# Patient Record
Sex: Male | Born: 2000 | Hispanic: Yes | Marital: Single | Smoking: Never smoker
Health system: Southern US, Community
[De-identification: ages and names within clinical notes are randomized; demographics above are authoritative.]

## PROBLEM LIST (undated history)

## (undated) DIAGNOSIS — R040 Epistaxis: Secondary | ICD-10-CM

## (undated) DIAGNOSIS — G43909 Migraine, unspecified, not intractable, without status migrainosus: Secondary | ICD-10-CM

## (undated) HISTORY — DX: Epistaxis: R04.0

## (undated) HISTORY — PX: TONSILLECTOMY: SUR1361

---
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
## 2022-10-04 ENCOUNTER — Other Ambulatory Visit: Payer: Self-pay

## 2022-10-24 ENCOUNTER — Ambulatory Visit (HOSPITAL_COMMUNITY)
Admission: EM | Admit: 2022-10-24 | Discharge: 2022-10-24 | Disposition: A | Discharge status: Home / Self Care | Payer: Medicaid Other | Attending: Internal Medicine | Admitting: Internal Medicine

## 2022-10-24 ENCOUNTER — Encounter (HOSPITAL_COMMUNITY): Payer: Self-pay | Admitting: Emergency Medicine

## 2022-10-24 ENCOUNTER — Other Ambulatory Visit: Payer: Self-pay

## 2022-10-24 DIAGNOSIS — L600 Ingrowing nail: Secondary | ICD-10-CM

## 2022-10-24 MED ORDER — BUPIVACAINE HCL (PF) 0.5 % IJ SOLN
INTRAMUSCULAR | Status: AC
Start: 2022-10-24 — End: ?
  Filled 2022-10-24: qty 10

## 2022-10-24 MED ORDER — CEPHALEXIN 500 MG PO CAPS: 500.0000 mg | ORAL_CAPSULE | Freq: Three times a day (TID) | ORAL | 0 refills | Status: AC

## 2022-10-24 NOTE — Discharge Instructions (Signed)
Keflex 3 times a day for 7 days to treat infection. Numbing will wear off in the next several hours.  Ibuprofen as needed for pain once numbing wears off. Epsom salt soaks every 4 hours for the next few days.  Keep wound cleansed, dry, and wrapped.   Work note at the end of the packet.  [facility] with [facility] in 1-2 weeks.  If you develop any new or worsening symptoms or do not improve in the next 2 to 3 days, please return.  If your symptoms are severe, please go to the emergency room.  Follow-up with your primary care provider for further evaluation and management of your symptoms as well as ongoing wellness visits.  I hope you feel better!

## 2022-10-24 NOTE — ED Notes (Signed)
Reviewed work note and patient was giving wound dressing supplies

## 2022-10-24 NOTE — ED Provider Notes (Signed)
[facility]    CSN: [id number] Arrival date & time: 10/24/22  1191      History   Chief Complaint Chief Complaint  Patient presents with   Ingrown Toenail    HPI Casey Gutierrez is a 22 y.o. male.   Patient presents to urgent care for evaluation of redness, swelling, pain, and purulent drainage to the lateral aspect of the left great toenail that started a few days ago but has worsened in the last 24 hours.  He states he works at a [employer] and his steel toed shoes are not tight.  He has plenty of room to move his feet in his shoes, however reports his toes "slide around a lot" and he believes this may have caused some irritation to the left great toenail.  Denies a history of ingrown toenails, numbness and tingling distally to injury, and recent fever/chills.  No recent injury/trauma to the left great toe.  Over the last 2 to 3 days, he has noticed purulent drainage from the lateral nail fold of the left great toe after long periods of time on his feet at work and his steel toed shoes.  He has not trimmed his toenails recently, however did attempt to relieve ingrown toenail by trimming the nail yesterday.  He believes this made this worse.  Using peroxide to the wound without much relief.  He is not diabetic and denies history of immunosuppression.     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 Authorizing Provider  cephALEXin  (KEFLEX) 500 MG capsule Take 1 capsule (500 mg total) by mouth 3 (three) times daily for 7 days. 10/24/22 10/31/22 Yes Brennon Otterness, Donavan Burnet, FNP  montelukast (SINGULAIR) 10 MG tablet Take 1 tablet (10 mg total) by mouth at bedtime. Patient not taking: Reported on 10/24/2022 08/26/21   Maretta Bees, PA    Family History Family History  Problem Relation Age of Onset   Hypertension Mother    Diabetes Mother    Obesity Brother        also with non-alcoholic fatty liver disease   Obesity Brother        also with nonalcoholic fatty liver disease    Social History Social History   Tobacco Use   Smoking status: Never   Smokeless tobacco: Never  Vaping Use   Vaping Use: Never used  Substance Use Topics   Alcohol use: No   Drug use: Never     Allergies   Patient has no known allergies.   Review of Systems Review of Systems Per HPI  Physical Exam Triage Vital Signs ED Triage Vitals  Enc Vitals Group     BP 10/24/22 0935 127/72     Pulse Rate 10/24/22 0935 70     Resp 10/24/22 0935 18     Temp 10/24/22 0935 98 F (36.7 C)     Temp Source 10/24/22  0935 Oral     SpO2 10/24/22 0935 97 %     Weight --      Height --      Head Circumference --      Peak Flow --      Pain Score 10/24/22 0932 8     Pain Loc --      Pain Edu? --      Excl. in GC? --    No data found.  Updated Vital Signs BP 127/72 (BP Location: Right Arm)   Pulse 70   Temp 98 F (36.7 C) (Oral)   Resp 18   SpO2 97%   Visual Acuity Right Eye Distance:   Left Eye Distance:   Bilateral Distance:    Right Eye Near:   Left Eye Near:    Bilateral Near:     Physical Exam Vitals and nursing note reviewed.  Constitutional:      Appearance: He is not ill-appearing or toxic-appearing.  HENT:     Head: Normocephalic and atraumatic.     Right Ear: Hearing and external ear normal.     Left Ear: Hearing and external ear normal.     Nose: Nose normal.     Mouth/Throat:     Lips: Pink.  Eyes:      General: Lids are normal. Vision grossly intact. Gaze aligned appropriately.     Extraocular Movements: Extraocular movements intact.     Conjunctiva/sclera: Conjunctivae normal.  Cardiovascular:     Pulses:          Posterior tibial pulses are 2+ on the left side.  Pulmonary:     Effort: Pulmonary effort is normal.  Musculoskeletal:     Cervical back: Neck supple.  Feet:     Left foot:     Skin integrity: Erythema and warmth present.     Toenail Condition: Left toenails are ingrown.     Comments: Ingrown toenail to the lateral nail fold of the left great toe.  Significant swelling, erythema, and tenderness present to lateral nail fold of affected digit.  +2 dorsalis pedis pulse to the left foot with less than 3 capillary refill to affected digit.  Sensation and strength intact distally.  No signs of injury/trauma to the left great toe.  See image below for further details. Skin:    General: Skin is warm and dry.     Capillary Refill: Capillary refill takes less than 2 seconds.     Findings: No rash.  Neurological:     General: No focal deficit present.     Mental Status: He is alert and oriented to person, place, and time. Mental status is at baseline.     Cranial Nerves: No dysarthria or facial asymmetry.  Psychiatric:        Mood and Affect: Mood normal.        Speech: Speech normal.        Behavior: Behavior normal.        Thought Content: Thought content normal.        Judgment: Judgment normal.     UC Treatments / Results  Labs (all labs ordered are listed, but only abnormal results are displayed) Labs Reviewed - No data to display  EKG   Radiology No results found.  Procedures Excise Ingrown Toenail  Date/Time: 10/24/2022 10:55 AM  Performed by: Carlisle Beers, FNP Authorized by: Carlisle Beers, FNP   Consent:    Consent obtained:  Verbal   Consent given by:  Patient   Risks, benefits, and alternatives were discussed: yes     Risks discussed:   Bleeding, incomplete removal, infection, permanent nail deformity and pain   Alternatives discussed:  No treatment and delayed treatment Universal protocol:    Patient identity confirmed:  Verbally with patient Location:    Foot:  L big toe Pre-procedure details:    Skin preparation:  Alcohol and povidone-iodine Anesthesia:    Anesthesia method:  Nerve block   Block location:  First toe of left foot   Block needle gauge:  25 G   Block anesthetic:  Bupivacaine 0.5% w/o epi   Block injection procedure:  Anatomic landmarks palpated, anatomic landmarks identified, introduced needle, incremental injection and negative aspiration for blood   Block outcome:  Anesthesia achieved Ingrown nail:    Wedge excision of skin: yes     Nail matrix removed or ablated:  Partial (medial) Nails trimmed:    Number of nails trimmed:  1 Post-procedure details:    Dressing: non-stick gauze and coban.   Procedure completion:  Tolerated well, no immediate complications  (including critical care time)  Medications Ordered in UC Medications - No data to display  Initial Impression / Assessment and Plan / UC Course  I have reviewed the triage vital signs and the nursing notes.  Pertinent labs & imaging results that were available during my care of the patient were reviewed by me and considered in my medical decision making (see chart for details).   1. Ingrown toenail of left foot with infection Ingrown toenail of the left great toe excised, see procedure note above for further details.  Epsom salt soaks every 4 hours recommended over the next several days.  May use ibuprofen as needed for pain once numbing wears off from digital block.  Keflex antibiotic sent to pharmacy to be taken 3 times daily for the next 7 days to treat infection.  Walking referral to [facility] given for ongoing evaluation management of ingrown toenail and to monitor growth process as toenail returns back to normal.  Work note  provided.  Encourage patient to avoid wearing tight fitting shoes and avoid cutting toenails short as this will lead to further ingrown toenails.  Discussed physical exam and available lab work findings in clinic with patient.  Counseled patient regarding appropriate use of medications and potential side effects for all medications recommended or prescribed today. Discussed red flag signs and symptoms of worsening condition,when to call the PCP office, return to urgent care, and when to seek higher level of care in the emergency department. Patient verbalizes understanding and agreement with plan. All questions answered. Patient discharged in stable condition.    Final Clinical Impressions(s) / UC Diagnoses   Final diagnoses:  Ingrown toenail of left foot with infection     Discharge Instructions      Keflex 3 times a day for 7 days to treat infection. Numbing will wear off in the next several hours.  Ibuprofen as needed for pain once numbing wears off. Epsom salt soaks every 4 hours for the next few days.  Keep wound cleansed, dry, and wrapped.   Work note at the end of the packet.  [facility] in 1-2 weeks.  If you develop any new or worsening symptoms or do not improve in the next 2 to 3 days, please return.  If your symptoms are severe, please go to the emergency room.  Follow-up with your primary care provider for  further evaluation and management of your symptoms as well as ongoing wellness visits.  I hope you feel better!     ED Prescriptions     Medication Sig Dispense Auth. Provider   cephALEXin (KEFLEX) 500 MG capsule Take 1 capsule (500 mg total) by mouth 3 (three) times daily for 7 days. 21 capsule Carlisle Beers, FNP      PDMP not reviewed this encounter.   Carlisle Beers, [place] 10/24/22 1058

## 2022-10-24 NOTE — ED Triage Notes (Addendum)
Patient reports left great toenail is painful, toe is painful.  Redness and puffiness to area around toenail.  Patient has seen drainage from this toe.    Patient works in [employer].    Patient has put peroxide on toe  Patient tried to "fix" this toe by cutting toenail "out"

## 2022-11-01 ENCOUNTER — Ambulatory Visit (INDEPENDENT_AMBULATORY_CARE_PROVIDER_SITE_OTHER): Payer: Medicaid Other | Admitting: Medical

## 2022-11-01 ENCOUNTER — Encounter: Payer: Self-pay | Admitting: Medical

## 2022-11-01 VITALS — BP 128/88 | HR 71 | Temp 98.1°F | Resp 16 | Ht 70.5 in | Wt 218.0 lb

## 2022-11-01 DIAGNOSIS — Z8679 Personal history of other diseases of the circulatory system: Secondary | ICD-10-CM

## 2022-11-01 DIAGNOSIS — M545 Low back pain, unspecified: Secondary | ICD-10-CM | POA: Diagnosis not present

## 2022-11-01 DIAGNOSIS — Z6830 Body mass index (BMI) 30.0-30.9, adult: Secondary | ICD-10-CM

## 2022-11-01 DIAGNOSIS — E782 Mixed hyperlipidemia: Secondary | ICD-10-CM | POA: Diagnosis not present

## 2022-11-01 DIAGNOSIS — L600 Ingrowing nail: Secondary | ICD-10-CM

## 2022-11-01 DIAGNOSIS — G43809 Other migraine, not intractable, without status migrainosus: Secondary | ICD-10-CM | POA: Diagnosis not present

## 2022-11-01 NOTE — Patient Instructions (Addendum)
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 Avoid getting dehydrated If you start getting more frequent migraines then we can discuss other treatment recommendations  You have a history of mixed dyslipidemia or abnormal cholesterol and triglycerides Recommendations for improving lipids:  Foods TO AVOID or limit - fried foods, high sugar foods, white bread, enriched flour, fast food, red meat, large amounts of cheese, processed foods such as little debbie cakes, cookies, pies, donuts, for example  Foods TO INCLUDE in the diet - whole grains such as whole grain pasta, whole grain bread, barley, steel cut oatmeal (not instant oatmeal), avocado, fish, green leafy vegetables, nuts, increased fiber in diet, and using olive oil in small amounts for cooking or as salad dressing vinaigrette.     Low back pain Stretch regularly, daily Use good proper technique when lifting so you do not injure yourself You can see a chiropractor or massage therapist periodically to help with muscle spasm and pain   Follow-up in a few months for a fasting physical

## 2022-11-01 NOTE — Progress Notes (Signed)
Subjective:  Casey Gutierrez is a 22 y.o. male who presents for Chief Complaint  Patient presents with   Establish Care     Here as a new patient to establish care.  He was seeing pediatrics prior.  Looking in the chart record he had a recent emergency department visit for ingrown toenail.  Had part of toenail removed.  On antibiotic.   Improving.  No prior ingrown toenail.  Has podiatry appt tomorrow for follow up.  Chart shows hx/o high cholesterol.   Lives with parents and siblings  works at a warehouse.   Has hx/o headaches, starting in late elementary school, hx/o migraines.   No major migraine in a while, just minor headaches here and there.  Sometimes gets back pain.     No other aggravating or relieving factors.    No other c/o.   Past Medical History:  Diagnosis Date   Migraines    Current Outpatient Medications on File Prior to Visit  Medication Sig Dispense Refill   cephALEXin (KEFLEX) 500 MG capsule Take 500 mg by mouth 3 (three) times daily.     No current facility-administered medications on file prior to visit.    The following portions of the patient's history were reviewed and updated as appropriate: allergies, current medications, past family history, past medical history, past social history, past surgical history and problem list.  ROS Otherwise as in subjective above    Objective: BP 128/88   Pulse 71   Temp 98.1 F (36.7 C) (Oral)   Resp 16   Ht 5' 10.5" (1.791 m)   Wt 218 lb (98.9 kg)   SpO2 96% Comment: room air  BMI 30.84 kg/m   Wt Readings from Last 3 Encounters:  11/01/22 218 lb (98.9 kg) (97 %, Z= 1.82)*  03/28/21 240 lb 4 oz (109 kg) (99 %, Z= 2.32)*  12/24/19 231 lb (104.8 kg) (>99 %, Z= 2.35)*   * Growth percentiles are based on CDC (Boys, 2-20 Years) data.   BP Readings from Last 3 Encounters:  11/01/22 128/88  10/24/22 127/72  08/26/21 126/86   General appearance: alert, no distress, well developed, well  nourished Left great toe with mild erythema and swelling of lateral nail bed, but otherwise improved from his ED visit.   Neck: supple, no lymphadenopathy, no thyromegaly, no masses Heart: RRR, normal S1, S2, no murmurs Lungs: CTA bilaterally, no wheezes, rhonchi, or rales Abdomen: +bs, soft, non tender, non distended, no masses, no hepatomegaly, no splenomegaly Pulses: 2+ radial pulses, 2+ pedal pulses, normal cap refill Ext: no edema   Assessment: Encounter Diagnoses  Name Primary?   Ingrown toenail Yes   History of hypertension    BMI 30.0-30.9,adult    Mixed dyslipidemia    Other migraine without status migrainosus, not intractable    Low back pain without sciatica, unspecified back pain laterality, unspecified chronicity      Plan: I reviewed his health history, recent emergency department visit and recommendations as below  Patient Instructions  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 Avoid getting dehydrated If you start getting more frequent migraines then we can discuss other treatment recommendations  You have a history of mixed dyslipidemia or abnormal  cholesterol and triglycerides Recommendations for improving lipids:  Foods TO AVOID or limit - fried foods, high sugar foods, white bread, enriched flour, fast food, red meat, large amounts of cheese, processed foods such as little debbie cakes, cookies, pies, donuts, for example  Foods TO INCLUDE in the diet - whole grains such as whole grain pasta, whole grain bread, barley, steel cut oatmeal (not instant oatmeal), avocado, fish, green leafy vegetables, nuts, increased fiber in diet, and using olive oil in small amounts for cooking or as salad  dressing vinaigrette.     Low back pain Stretch regularly, daily Use good proper technique when lifting so you do not injure yourself You can see a chiropractor or massage therapist periodically to help with muscle spasm and pain   Follow-up in a few months for a fasting physical   Jemell was seen today for establish care.  Diagnoses and all orders for this visit:  Ingrown toenail  History of hypertension  BMI 30.0-30.9,adult  Mixed dyslipidemia  Other migraine without status migrainosus, not intractable  Low back pain without sciatica, unspecified back pain laterality, unspecified chronicity    Follow up: for physical in a few months

## 2022-11-02 ENCOUNTER — Ambulatory Visit (INDEPENDENT_AMBULATORY_CARE_PROVIDER_SITE_OTHER): Payer: Medicaid Other | Admitting: Podiatry

## 2022-11-02 ENCOUNTER — Encounter: Payer: Self-pay | Admitting: Podiatry

## 2022-11-02 DIAGNOSIS — L600 Ingrowing nail: Secondary | ICD-10-CM | POA: Diagnosis not present

## 2022-11-02 NOTE — Progress Notes (Signed)
Subjective:   Patient ID: Casey Gutierrez, male   DOB: 22 y.o.   MRN: [id number]   HPI Patient states he had an ingrown toenail removed at the hospital temporarily because of infection and he is here for permanent procedure due to chronic nature of the condition.  Patient does not smoke likes to be active continues to take antibiotic currently   Review of Systems  All other systems reviewed and are negative.       Objective:  Physical Exam Vitals and nursing note reviewed.  Constitutional:      Appearance: He is well-developed.  Pulmonary:     Effort: Pulmonary effort is normal.  Musculoskeletal:        General: Normal range of motion.  Skin:    General: Skin is warm.  Neurological:     Mental Status: He is alert.     Neurovascular status intact muscle strength found to be adequate range of motion adequate incurvated lateral border left big toe with irritation of the bed no redness no drainage.  Good digital perfusion well-oriented F2 chronic ingrown toenail deformity left hallux lateral border with pain     Assessment:  Above describes     Plan:  H&P reviewed condition recommended correction allowed patient to read then signed consent form understanding risk and infiltrated the left big toe 60 mg like Marcaine mixture sterile prep done using sterile instrumentation remove the lateral border exposed matrix applied phenol 3 applications 30 seconds followed by alcohol lavage sterile dressing gave instructions on soaks wear dressing 24 hours take it off earlier if throbbing were to occur

## 2022-11-02 NOTE — Patient Instructions (Signed)

## 2023-01-06 ENCOUNTER — Other Ambulatory Visit: Payer: Self-pay

## 2023-01-06 ENCOUNTER — Encounter (HOSPITAL_COMMUNITY): Payer: Self-pay

## 2023-01-06 ENCOUNTER — Emergency Department (HOSPITAL_COMMUNITY)
Admission: EM | Admit: 2023-01-06 | Discharge: 2023-01-06 | Disposition: A | Discharge status: Home / Self Care | Payer: Medicaid Other | Source: Home / Self Care

## 2023-01-06 DIAGNOSIS — R21 Rash and other nonspecific skin eruption: Secondary | ICD-10-CM | POA: Diagnosis present

## 2023-01-06 DIAGNOSIS — L237 Allergic contact dermatitis due to plants, except food: Secondary | ICD-10-CM | POA: Insufficient documentation

## 2023-01-06 MED ORDER — DEXAMETHASONE SODIUM PHOSPHATE 10 MG/ML IJ SOLN
10.0000 mg | Freq: Once | INTRAMUSCULAR | Status: AC
  Administered 2023-01-06: 10 mg via INTRAMUSCULAR
  Filled 2023-01-06: qty 1

## 2023-01-06 MED ORDER — HYDROXYZINE HCL 25 MG PO TABS: 25.0000 mg | ORAL_TABLET | Freq: Four times a day (QID) | ORAL | 0 refills | Status: DC

## 2023-01-06 MED ORDER — CEPHALEXIN 500 MG PO CAPS: ORAL_CAPSULE | ORAL | 0 refills | Status: DC

## 2023-01-06 MED ORDER — PREDNISONE 20 MG PO TABS: ORAL_TABLET | ORAL | 0 refills | Status: DC

## 2023-01-06 NOTE — ED Triage Notes (Signed)
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

## 2023-01-06 NOTE — Discharge Instructions (Addendum)
Contact a health care provider if: You have open sores in the rash area. You have any signs of infection. You have redness that spreads beyond the rash area. You have a fever. You have a rash over a large area of your body. You have a rash on your eyes, mouth, or genitals. You have a rash that does not improve after a few weeks. Get help right away if: Your face swells or your eyes swell shut. You have trouble breathing. You have trouble swallowing. These symptoms may be an emergency. Get help right away. Call 911. Do not wait to see if the symptoms will go away. Do not drive yourself to the hospital.

## 2023-01-06 NOTE — ED Provider Notes (Signed)
EMERGENCY DEPARTMENT AT [facility] Provider Note   CSN: [id number] Arrival date & time: 01/06/23  1839     History  Chief Complaint  Patient presents with   Rash    Casey Gutierrez is a 22 y.o. male who presents for evaluation of rash.  Patient was clearing IV and brush over the weekend.  He is pretty sure he has a bad case of poison ivy.  He states he usually gets it every year but he has never had it this bad.  He developed an itchy rash with associated vesicles and bullae over the left arm.  He has some spots on the right arm and is developing some on his face.  He states that it is burning and painful and that his whole arm is swollen.  He denies fever or chills  Rash      Home Medications Prior to Admission medications   Medication Sig Start Date End Date Taking? Authorizing Provider  cephALEXin (KEFLEX) 500 MG capsule Take 500 mg by mouth 3 (three) times daily.    [provider]      Allergies    Patient has no known allergies.    Review of Systems   Review of Systems  Skin:  Positive for rash.    Physical Exam Updated Vital Signs Pulse 100   Temp 98.9 F (37.2 C) (Oral)   Resp (!) 21   Ht 5' 10.25" (1.784 m)   Wt 98.9 kg   SpO2 99%   BMI 31.06 kg/m  Physical Exam Physical Exam  Nursing note and vitals reviewed. Constitutional: He appears well-developed and well-nourished. No distress.  HENT:  Head: Normocephalic and atraumatic.  Eyes: Conjunctivae normal are normal. No scleral icterus.  Neck: Normal range of motion. Neck supple.  Cardiovascular: Normal rate, regular rhythm and normal heart sounds.   Pulmonary/Chest: Effort normal and breath sounds normal. No respiratory distress.  Abdominal: Soft. There is no tenderness.  Musculoskeletal: He exhibits no edema.  Neurological: He is alert.  Skin: Skin is warm and dry. He is not diaphoretic.  Large singular and confluent erythematous rash over the left forearm and  hand with small small vesicles and l a few bullae.  There is swelling noted.  He has some mild erythema.  There are also eruptions of various states over the right arm and face Psychiatric: His behavior is normal.   ED Results / Procedures / Treatments   Labs (all labs ordered are listed, but only abnormal results are displayed) Labs Reviewed - No data to display  EKG None  Radiology No results found.  Procedures Procedures    Medications Ordered in ED Medications  dexamethasone (DECADRON) injection 10 mg (has no administration in time range)    ED Course/ Medical Decision Making/ A&P                             Medical Decision Making Risk Prescription drug management.   Patient here with severe contact dermatitis.  Although I suspect this is most likely just reaction to the contact dermatitis will cover for secondary infection with Keflex.  Given a shot of Decadron.  Will be discharged with 2-week taper of prednisone and Atarax.  Discussed home treatment and return precautions.        Final Clinical Impression(s) / ED Diagnoses Final diagnoses:  Poison ivy dermatitis    Rx / DC Orders ED Discharge Orders  None         Arthor Captain, PA-C 01/06/23 Guinevere Ferrari, MD 01/06/23 2043

## 2023-01-24 ENCOUNTER — Encounter: Payer: Medicaid Other | Admitting: Medical

## 2023-02-21 ENCOUNTER — Encounter (HOSPITAL_COMMUNITY): Payer: Self-pay

## 2023-02-21 ENCOUNTER — Ambulatory Visit (HOSPITAL_COMMUNITY)
Admission: EM | Admit: 2023-02-21 | Discharge: 2023-02-21 | Disposition: A | Discharge status: Home / Self Care | Payer: Medicaid Other | Attending: Emergency Medicine | Admitting: Emergency Medicine

## 2023-02-21 DIAGNOSIS — K529 Noninfective gastroenteritis and colitis, unspecified: Secondary | ICD-10-CM

## 2023-02-21 MED ORDER — ONDANSETRON HCL 4 MG PO TABS: 4.0000 mg | ORAL_TABLET | Freq: Four times a day (QID) | ORAL | 0 refills | Status: DC

## 2023-02-21 NOTE — Discharge Instructions (Signed)
Buy Imodium from the pharmacy and take according to the directions on the package for your diarrhea.   Stay hydrated - water is good, but also add in something with salt and maybe a little sugar (gatorade, pedialyte). Also eat bland, simple foods such as chicken soup, jello, applesauce, crackers - see the handout attached on food for diarrhea.   Use the nausea medicine if needed

## 2023-02-21 NOTE — ED Provider Notes (Signed)
[facility]    CSN: [id number] Arrival date & time: 02/21/23  0809      History   Chief Complaint Chief Complaint  Patient presents with   Headache   Diarrhea   Nausea    HPI Casey Gutierrez is a 22 y.o. male.  He complains ofdiarrhea, headache and nausea x 4 days.  Started initially with headache and fatigue.  Diarrhea up to 16 times a day started 02/19/2023.  Yesterday he had 5 episodes of diarrhea, so it is tapering off some.  Tried to eat yesterday and threw it up; this is his only instance of vomiting.  He does still feel nauseated.  He has been drinking only water to stay hydrated.  He feels tired and weak.  He has not traveled out of the country or had water from a contaminated source.  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 Authorizing Provider  ondansetron (ZOFRAN) 4 MG tablet Take 1 tablet (4 mg total) by mouth every 6 (six) hours. 02/21/23  Yes Cathlyn Parsons, NP  cephALEXin (KEFLEX) 500 MG capsule Take 500 mg by mouth 3 (three) times daily.    [provider]  cephALEXin (KEFLEX) 500 MG capsule 2 caps po bid x 7 days 01/06/23   Arthor Captain, PA-C  hydrOXYzine (ATARAX) 25 MG tablet Take 1 tablet (25 mg total) by mouth every 6 (six) hours.  01/06/23   Arthor Captain, PA-C  predniSONE (DELTASONE) 20 MG tablet 3 tabs po daily x 3 days, then 2 tabs x 3 days, then 1.5 tabs x 3 days, then 1 tab x 3 days, then 0.5 tabs x 3 days 01/06/23   Arthor Captain, PA-C    Family History Family History  Problem Relation Age of Onset   Hypertension Mother    Diabetes Mother    Obesity Brother        also with non-alcoholic fatty liver disease   Obesity Brother        also with nonalcoholic fatty liver disease    Social History Social History   Tobacco Use   Smoking status: Never   Smokeless tobacco: Never  Vaping Use   Vaping status: Never Used  Substance Use Topics   Alcohol use: No   Drug use: Never     Allergies   Patient has no known allergies.   Review of Systems Review of Systems  Gastrointestinal:  Positive for diarrhea.  Neurological:  Positive for headaches.     Physical Exam Triage Vital Signs ED Triage Vitals [02/21/23 0833]  Encounter Vitals Group     BP 117/79     Systolic BP Percentile      Diastolic BP Percentile  Pulse Rate 82     Resp 16     Temp 98.9 F (37.2 C)     Temp Source Oral     SpO2 98 %     Weight      Height      Head Circumference      Peak Flow      Pain Score      Pain Loc      Pain Education      Exclude from Growth Chart    No data found.  Updated Vital Signs BP 117/79 (BP Location: Left Arm)   Pulse 82   Temp 98.9 F (37.2 C) (Oral)   Resp 16   SpO2 98%   Visual Acuity Right Eye Distance:   Left Eye Distance:   Bilateral Distance:    Right Eye Near:   Left Eye Near:    Bilateral Near:     Physical Exam Constitutional:      Appearance: He is well-developed.  Cardiovascular:     Rate and Rhythm: Normal rate and regular rhythm.  Pulmonary:     Effort: Pulmonary effort is normal.     Breath sounds: Normal breath sounds.  Abdominal:     General: Bowel sounds are normal.     Palpations: Abdomen is soft.     Tenderness: There is generalized  abdominal tenderness. There is no guarding.  Neurological:     Mental Status: He is alert.      UC Treatments / Results  Labs (all labs ordered are listed, but only abnormal results are displayed) Labs Reviewed - No data to display  EKG   Radiology No results found.  Procedures Procedures (including critical care time)  Medications Ordered in UC Medications - No data to display  Initial Impression / Assessment and Plan / UC Course  I have reviewed the triage vital signs and the nursing notes.  Pertinent labs & imaging results that were available during my care of the patient were reviewed by me and considered in my medical decision making (see chart for details).    Likely gastroenteritis.  Discussed food choices for diarrhea.  Recommended Imodium, prescribed Zofran.  Discussed progression from dental to healing and what he can expect.  Final Clinical Impressions(s) / UC Diagnoses   Final diagnoses:  Noninfectious gastroenteritis, unspecified type     Discharge Instructions      Buy Imodium from the pharmacy and take according to the directions on the package for your diarrhea.   Stay hydrated - water is good, but also add in something with salt and maybe a little sugar (gatorade, pedialyte). Also eat bland, simple foods such as chicken soup, jello, applesauce, crackers - see the handout attached on food for diarrhea.   Use the nausea medicine if needed   ED Prescriptions     Medication Sig Dispense Auth. Provider   ondansetron (ZOFRAN) 4 MG tablet Take 1 tablet (4 mg total) by mouth every 6 (six) hours. 12 tablet Cathlyn Parsons, NP      PDMP not reviewed this encounter.   Cathlyn Parsons, NP 02/21/23 [phone]

## 2023-02-21 NOTE — ED Triage Notes (Signed)
Pt presents to office for diarrhea, headache and nausea x 4 days. Pt reports some weakness.

## 2023-03-18 ENCOUNTER — Ambulatory Visit (INDEPENDENT_AMBULATORY_CARE_PROVIDER_SITE_OTHER): Payer: Medicaid Other | Admitting: Medical

## 2023-03-18 ENCOUNTER — Encounter: Payer: Self-pay | Admitting: Medical

## 2023-03-18 VITALS — BP 120/80 | HR 80 | Ht 70.0 in | Wt 206.6 lb

## 2023-03-18 DIAGNOSIS — M545 Low back pain, unspecified: Secondary | ICD-10-CM

## 2023-03-18 DIAGNOSIS — G8929 Other chronic pain: Secondary | ICD-10-CM | POA: Insufficient documentation

## 2023-03-18 DIAGNOSIS — R040 Epistaxis: Secondary | ICD-10-CM

## 2023-03-18 DIAGNOSIS — Z23 Encounter for immunization: Secondary | ICD-10-CM | POA: Diagnosis not present

## 2023-03-18 DIAGNOSIS — Z13220 Encounter for screening for lipoid disorders: Secondary | ICD-10-CM | POA: Diagnosis not present

## 2023-03-18 DIAGNOSIS — M6740 Ganglion, unspecified site: Secondary | ICD-10-CM | POA: Diagnosis not present

## 2023-03-18 DIAGNOSIS — Z0000 Encounter for general adult medical examination without abnormal findings: Secondary | ICD-10-CM | POA: Diagnosis not present

## 2023-03-18 DIAGNOSIS — Z113 Encounter for screening for infections with a predominantly sexual mode of transmission: Secondary | ICD-10-CM

## 2023-03-18 NOTE — Progress Notes (Signed)
Subjective:   HPI  Casey Gutierrez is a 22 y.o. male who presents for Chief Complaint  Patient presents with   Annual Exam    Cpe, weird bump on right wrist and bumps on back, flu shot, declines covid    Patient Care Team: Tayjah Lobdell, Kermit Balo, PA-C as PCP - General (Family Medicine)   Concerns: Gets nosebleeds somewhat regularly for years, only on right nare  Has chronic back pains.  Works actively on the job, pain in mid to low back. No specific injury  Has cyst on right wrist and left forearm.  No pain, just wants them looked at.    Reviewed their medical, surgical, family, social, medication, and allergy history and updated chart as appropriate.  No Known Allergies  Past Medical History:  Diagnosis Date   Migraines    Nosebleed    x years as of 2024    No current outpatient medications on file prior to visit.   No current facility-administered medications on file prior to visit.     No current outpatient medications on file.  Family History  Problem Relation Age of Onset   Hypertension Mother    Diabetes Mother    Polycystic ovary syndrome Sister    Obesity Brother        also with non-alcoholic fatty liver disease   Obesity Brother        also with nonalcoholic fatty liver disease   Cancer Neg Hx    Heart disease Neg Hx     Past Surgical History:  Procedure Laterality Date   MYRINGOTOMY WITH TUBE PLACEMENT  2.13   1.18.16 normal hearing bilaterally   TONSILLECTOMY      Review of Systems  Constitutional:  Negative for chills, fever, malaise/fatigue and weight loss.  HENT:  Negative for congestion, ear pain, hearing loss, sore throat and tinnitus.   Eyes:  Negative for blurred vision, pain and redness.  Respiratory:  Negative for cough, hemoptysis and shortness of breath.   Cardiovascular:  Negative for chest pain, palpitations, orthopnea, claudication and leg swelling.  Gastrointestinal:  Negative for abdominal pain, blood in stool,  constipation, diarrhea, nausea and vomiting.  Genitourinary:  Negative for dysuria, flank pain, frequency, hematuria and urgency.  Musculoskeletal:  Negative for falls, joint pain and myalgias.  Skin:  Negative for itching and rash.  Neurological:  Negative for dizziness, tingling, speech change, weakness and headaches.  Endo/Heme/Allergies:  Negative for polydipsia. Does not bruise/bleed easily.  Psychiatric/Behavioral:  Negative for depression and memory loss. The patient is not nervous/anxious and does not have insomnia.        Objective:  BP 120/80   Pulse 80   Ht 5\' 10"  (1.778 m)   Wt 206 lb 9.6 oz (93.7 kg)   BMI 29.64 kg/m   General appearance: alert, no distress, WD/WN, Caucasian male Skin: Right volar wrist medially with mobile subcutaneous 7mm diameter cystic lesion suggestive of ganglion cyst, right anterior forearm just distal to antecubital region with 4 mm mobile subcutaneous cyst, nontender.  Unremarkable otherwise HEENT: normocephalic, conjunctiva/corneas normal, sclerae anicteric, PERRLA, EOMi, nares -right side with erythema and somewhat inflamed anterior area, otherwise no discharge or erythema, pharynx normal Oral cavity: MMM, tongue normal, teeth normal Neck: supple, no lymphadenopathy, no thyromegaly, no masses, normal ROM, no bruits Chest: non tender, normal shape and expansion Heart: RRR, normal S1, S2, no murmurs Lungs: CTA bilaterally, no wheezes, rhonchi, or rales Abdomen: +bs, soft, non tender, non distended, no masses, no hepatomegaly, no  splenomegaly, no bruits Back: Tender mid paraspinal back as well as right lumbar paraspinal region but normal range of motion without deformity, otherwise non tender, normal ROM, no scoliosis Musculoskeletal: upper extremities non tender, no obvious deformity, normal ROM throughout, lower extremities non tender, no obvious deformity, normal ROM throughout Extremities: no edema, no cyanosis, no clubbing Pulses: 2+  symmetric, upper and lower extremities, normal cap refill Neurological: alert, oriented x 3, CN2-12 intact, strength normal upper extremities and lower extremities, sensation normal throughout, DTRs 2+ throughout, no cerebellar signs, gait normal Psychiatric: normal affect, behavior normal, pleasant  GU: normal male external genitalia,uncircumcised, nontender, no masses, no hernia, no lymphadenopathy Rectal: deferred    Assessment and Plan :   Encounter Diagnoses  Name Primary?   Encounter for health maintenance examination in adult Yes   Needs flu shot    Screening for lipid disorders    Frequent nosebleeds    Chronic bilateral low back pain without sciatica    Screen for STD (sexually transmitted disease)    Ganglion cyst     This visit was a preventative care visit, also known as wellness visit or routine physical.   Topics typically include healthy lifestyle, diet, exercise, preventative care, vaccinations, sick and well care, proper use of emergency dept and after hours care, as well as other concerns.     Separate significant issues discussed: Cyst of skin forearm and ganglion right wrist - not currently causing a problem. Reassured  Nosebleeds - pending labs, likely referral to ENT  Chronic low back pain - offered baseline T and L spine xrays, daily stretching advised, consider PT or chiropractic therapy     General Recommendations: Continue to return yearly for your annual wellness and preventative care visits.  This gives [place] a chance to discuss healthy lifestyle, exercise, vaccinations, review your chart record, and perform screenings where appropriate.  I recommend you see your eye doctor yearly for routine vision care.  I recommend you see your dentist yearly for routine dental care including hygiene visits twice yearly.   Vaccination  Immunization History  Administered Date(s) Administered   DTaP 04/14/2003, 07/14/2003, 08/27/2003, 05/30/2004, 03/31/2007    HIB (PRP-OMP) 04/14/2003, 07/14/2003, 08/27/2003, 02/25/2004   HPV 9-valent 03/11/2015   HPV Quadrivalent 12/28/2013, 01/29/2014   Hepatitis A 03/27/2006, 03/31/2007   Hepatitis B 10/18/2002, 03/10/2003, 02/27/2004   IPV 04/14/2003, 07/14/2003, 05/30/2004, 03/31/2007   Influenza, Seasonal, Injecte, Preservative Fre 03/18/2023   Influenza,inj,Quad PF,6+ Mos 03/11/2015, 08/01/2017, 08/05/2018, 08/13/2019, 03/28/2021   Influenza-Unspecified 05/14/2011, 03/06/2012, 07/03/2013   MMR 02/25/2004, 03/31/2007   Meningococcal Conjugate 12/28/2013, 08/13/2019   Pneumococcal Conjugate-13 04/14/2003, 07/14/2003, 08/27/2003, 02/25/2004   Tdap 12/28/2013   Varicella 02/25/2004, 03/31/2007    Counseled on the influenza virus vaccine.  Vaccine information sheet given.  Influenza vaccine given after consent obtained.    Screening for cancer: Colon cancer screening: Age 60   Testicular cancer screening You should do a monthly self testicular exam if you are between 36-40 years old, and we typically do a testicular exam on the yearly physical for this same age group.   Prostate Cancer screening: The recommended prostate cancer screening test is a blood test called the prostate-specific antigen (PSA) test. PSA is a protein that is made in the prostate. As you age, your prostate naturally produces more PSA. Abnormally high PSA levels may be caused by: Prostate cancer. An enlarged prostate that is not caused by cancer (benign prostatic hyperplasia, or BPH). This condition is very common in older men.  A prostate gland infection (prostatitis) or urinary tract infection. Certain medicines such as male hormones (like testosterone) or other medicines that raise testosterone levels. A rectal exam may be done as part of prostate cancer screening to help provide information about the size of your prostate gland. When a rectal exam is performed, it should be done after the PSA level is drawn to avoid any effect  on the results.   Skin cancer screening: Check your skin regularly for new changes, growing lesions, or other lesions of concern Come in for evaluation if you have skin lesions of concern.   Lung cancer screening: If you have a greater than 20 pack year history of tobacco use, then you may qualify for lung cancer screening with a chest CT scan.   Please call your insurance company to inquire about coverage for this test.   Pancreatic cancer:  no current screening test is available or routinely recommended. (risk factors: smoking, overweight or obese, diabetes, chronic pancreatitis, work exposure - dry cleaning, metal working, 20yo>, M>F, Tree surgeon, family hx/o, hereditary breast, ovarian, melanoma, lynch, peutz-jeghers).  Symptoms: jaundice, dark urine, light color or greasy stools, itchy skin, belly or back pain, weight loss, poor appetite, nausea, vomiting, liver enlargement, DVT/blood clots.   We currently don't have screenings for other cancers besides breast, cervical, colon, and lung cancers.  If you have a strong family history of cancer or have other cancer screening concerns, please let me know.  Genetic testing referral is an option for individuals with high cancer risk in the family.  There are some other cancer screenings in development currently.   Bone health: Get at least 150 minutes of aerobic exercise weekly Get weight bearing exercise at least once weekly Bone density test:  A bone density test is an imaging test that uses a type of X-ray to measure the amount of calcium and other minerals in your bones. The test may be used to diagnose or screen you for a condition that causes weak or thin bones (osteoporosis), predict your risk for a broken bone (fracture), or determine how well your osteoporosis treatment is working. The bone density test is recommended for females 65 and older, or females or males <65 if certain risk factors such as thyroid disease, long term use  of steroids such as for asthma or rheumatological issues, vitamin D deficiency, estrogen deficiency, family history of osteoporosis, self or family history of fragility fracture in first degree relative.    Heart health: Get at least 150 minutes of aerobic exercise weekly Limit alcohol It is important to maintain a healthy blood pressure and healthy cholesterol numbers  Heart disease screening: Screening for heart disease includes screening for blood pressure, fasting lipids, glucose/diabetes screening, BMI height to weight ratio, reviewed of smoking status, physical activity, and diet.    Goals include blood pressure 120/80 or less, maintaining a healthy lipid/cholesterol profile, preventing diabetes or keeping diabetes numbers under good control, not smoking or using tobacco products, exercising most days per week or at least 150 minutes per week of exercise, and eating healthy variety of fruits and vegetables, healthy oils, and avoiding unhealthy food choices like fried food, fast food, high sugar and high cholesterol foods.    Other tests may possibly include EKG test, CT coronary calcium score, echocardiogram, exercise treadmill stress test.      Vascular disease screening: For higher risk individuals including smokers, diabetics, patients with known heart disease or high blood pressure, kidney disease, and others, screening for  vascular disease or atherosclerosis of the arteries is available.  Examples may include carotid ultrasound, abdominal aortic ultrasound, ABI blood flow screening in the legs, thoracic aorta screening.    Medical care options: I recommend you continue to seek care here first for routine care.  We try really hard to have available appointments Monday through Friday daytime hours for sick visits, acute visits, and physicals.  Urgent care should be used for after hours and weekends for significant issues that cannot wait till the next day.  The emergency department  should be used for significant potentially life-threatening emergencies.  The emergency department is expensive, can often have long wait times for less significant concerns, so try to utilize primary care, urgent care, or telemedicine when possible to avoid unnecessary trips to the emergency department.  Virtual visits and telemedicine have been introduced since the pandemic started in 2020, and can be convenient ways to receive medical care.  We offer virtual appointments as well to assist you in a variety of options to seek medical care.   Legal  Take the time to do a last will and testament, Advanced Directives including Health Care Power of Attorney and Living Will documents.  Don't leave your family with burdens that can be handled ahead of time.   Advanced Directives: I recommend you consider completing a Health Care Power of Attorney and Living Will.   These documents respect your wishes and help alleviate burdens on your loved ones if you were to become terminally ill or be in a position to need those documents enforced.    You can complete Advanced Directives yourself, have them notarized, then have copies made for our office, for you and for anybody you feel should have them in safe keeping.  Or, you can have an attorney prepare these documents.   If you haven't updated your Last Will and Testament in a while, it may be worthwhile having an attorney prepare these documents together and save on some costs.       Spiritual and Emotional Health Keeping a healthy spiritual life can help you better manage your physical health. Your spiritual life can help you to cope with any issues that may arise with your physical health.  Balance can keep [place] healthy and help [place] to recover.  If you are struggling with your spiritual health there are questions that you may want to ask yourself:  What makes me feel most complete? When do I feel most connected to the rest of the world? Where do I find the  most inner strength? What am I doing when I feel whole?  Helpful tips: Being in nature. Some people feel very connected and at peace when they are walking outdoors or are outside. Helping others. Some feel the largest sense of wellbeing when they are of service to others. Being of service can take on many forms. It can be doing volunteer work, being kind to strangers, or offering a hand to a friend in need. Gratitude. Some people find they feel the most connected when they remain grateful. They may make lists of all the things they are grateful for or say a thank you out loud for all they have.    Emotional Health Are you in tune with your emotional health?  Check out this link: http://www.marquez-love.com/    Financial Health Make sure you use a budget for your personal finances Make sure you are insured against risks (health insurance, life insurance, auto insurance, etc) Save more, spend  less Set financial goals If you need help in this area, good resources include counseling through [employer] or other community resources, have a meeting with a Social research officer, government, and a good resource is the [employer] was seen today for annual exam.  Diagnoses and all orders for this visit:  Encounter for health maintenance examination in adult -     Comprehensive metabolic panel -     CBC with Differential/Platelet -     PT and PTT -     Lipid panel -     RPR+HIV+GC+CT Panel -     Hepatitis C antibody -     Hepatitis B surface antigen  Needs flu shot -     Flu vaccine trivalent PF, 6mos and older(Flulaval,Afluria,Fluarix,Fluzone)  Screening for lipid disorders -     Lipid panel  Frequent nosebleeds -     PT and PTT  Chronic bilateral low back pain without sciatica  Screen for STD (sexually transmitted disease) -     RPR+HIV+GC+CT Panel -     Hepatitis C antibody -     Hepatitis B surface antigen  Ganglion cyst    Follow-up pending  labs, yearly for physical

## 2023-03-19 ENCOUNTER — Other Ambulatory Visit: Payer: Self-pay | Admitting: Medical

## 2023-03-19 DIAGNOSIS — R040 Epistaxis: Secondary | ICD-10-CM

## 2023-03-19 LAB — CBC WITH DIFFERENTIAL/PLATELET
Basophils Absolute: 0.1 10*3/uL (ref 0.0–0.2)
Basos: 1 %
EOS (ABSOLUTE): 0.2 10*3/uL (ref 0.0–0.4)
Eos: 2 %
Hematocrit: 45.6 % (ref 37.5–51.0)
Hemoglobin: 15.2 g/dL (ref 13.0–17.7)
Immature Grans (Abs): 0 10*3/uL (ref 0.0–0.1)
Immature Granulocytes: 0 %
Lymphocytes Absolute: 1.9 10*3/uL (ref 0.7–3.1)
Lymphs: 24 %
MCH: 31.5 pg (ref 26.6–33.0)
MCHC: 33.3 g/dL (ref 31.5–35.7)
MCV: 94 fL (ref 79–97)
Monocytes Absolute: 0.5 10*3/uL (ref 0.1–0.9)
Monocytes: 7 %
Neutrophils Absolute: 5.3 10*3/uL (ref 1.4–7.0)
Neutrophils: 66 %
Platelets: 157 10*3/uL (ref 150–450)
RBC: 4.83 x10E6/uL (ref 4.14–5.80)
RDW: 12.8 % (ref 11.6–15.4)
WBC: 8.1 10*3/uL (ref 3.4–10.8)

## 2023-03-19 LAB — COMPREHENSIVE METABOLIC PANEL
ALT: 36 IU/L (ref 0–44)
AST: 21 IU/L (ref 0–40)
Albumin: 5.1 g/dL (ref 4.3–5.2)
Alkaline Phosphatase: 93 IU/L (ref 51–125)
BUN/Creatinine Ratio: 11 (ref 9–20)
BUN: 9 mg/dL (ref 6–20)
Bilirubin Total: 0.3 mg/dL (ref 0.0–1.2)
CO2: 23 mmol/L (ref 20–29)
Calcium: 10.4 mg/dL — ABNORMAL HIGH (ref 8.7–10.2)
Chloride: 103 mmol/L (ref 96–106)
Creatinine, Ser: 0.8 mg/dL (ref 0.76–1.27)
Globulin, Total: 2.5 g/dL (ref 1.5–4.5)
Glucose: 84 mg/dL (ref 70–99)
Potassium: 4.7 mmol/L (ref 3.5–5.2)
Sodium: 142 mmol/L (ref 134–144)
Total Protein: 7.6 g/dL (ref 6.0–8.5)
eGFR: 130 mL/min/{1.73_m2} (ref >59)

## 2023-03-19 LAB — PT AND PTT
INR: 1 (ref 0.9–1.2)
Prothrombin Time: 11.1 s (ref 9.1–12.0)
aPTT: 27 s (ref 24–33)

## 2023-03-19 LAB — LIPID PANEL
Cholesterol, Total: 242 mg/dL — ABNORMAL HIGH (ref 100–199)
HDL: 48 mg/dL (ref >39)
LDL CALC COMMENT:: 5 ratio (ref 0.0–5.0)
LDL Chol Calc (NIH): 143 mg/dL — ABNORMAL HIGH (ref 0–99)
Triglycerides: 280 mg/dL — ABNORMAL HIGH (ref 0–149)
VLDL Cholesterol Cal: 51 mg/dL — ABNORMAL HIGH (ref 5–40)

## 2023-03-19 LAB — HEPATITIS C ANTIBODY

## 2023-03-19 LAB — RPR+HIV+GC+CT PANEL

## 2023-03-19 LAB — HEPATITIS B SURFACE ANTIGEN

## 2023-03-19 NOTE — Progress Notes (Signed)
Results sent through MyChart

## 2023-03-20 NOTE — Progress Notes (Signed)
Results sent through MyChart

## 2023-04-01 ENCOUNTER — Encounter (INDEPENDENT_AMBULATORY_CARE_PROVIDER_SITE_OTHER): Payer: Self-pay | Admitting: Otolaryngology

## 2023-08-20 ENCOUNTER — Encounter (HOSPITAL_COMMUNITY): Payer: Self-pay

## 2023-08-20 ENCOUNTER — Ambulatory Visit (HOSPITAL_COMMUNITY)
Admission: EM | Admit: 2023-08-20 | Discharge: 2023-08-20 | Disposition: A | Discharge status: Home / Self Care | Attending: Family Medicine | Admitting: Family Medicine

## 2023-08-20 DIAGNOSIS — B349 Viral infection, unspecified: Secondary | ICD-10-CM

## 2023-08-20 LAB — POCT INFLUENZA A/B
Influenza A, POC: NEGATIVE
Influenza B, POC: NEGATIVE

## 2023-08-20 MED ORDER — AZELASTINE HCL 0.1 % NA SOLN: 1.0000 | Freq: Two times a day (BID) | NASAL | 1 refills | Status: AC

## 2023-08-20 MED ORDER — ACETAMINOPHEN 325 MG PO TABS
650.0000 mg | ORAL_TABLET | Freq: Once | ORAL | Status: AC
  Administered 2023-08-20: 650 mg via ORAL

## 2023-08-20 MED ORDER — ACETAMINOPHEN 325 MG PO TABS
ORAL_TABLET | ORAL | Status: AC
  Filled 2023-08-20: qty 2

## 2023-08-20 MED ORDER — BENZONATATE 200 MG PO CAPS: 200.0000 mg | ORAL_CAPSULE | Freq: Three times a day (TID) | ORAL | 0 refills | Status: AC | PRN

## 2023-08-20 MED ORDER — IBUPROFEN 800 MG PO TABS: 800.0000 mg | ORAL_TABLET | Freq: Three times a day (TID) | ORAL | 0 refills | Status: AC

## 2023-08-20 NOTE — ED Triage Notes (Signed)
 Patient here today with c/o cough, fever, nasal congestion, chills, dizziness, fatigue, body aches, and weakness since Sunday. He has been taking Sudafed and a nasal spray with no relief. No known sick contacts.

## 2023-08-20 NOTE — Discharge Instructions (Addendum)
 Acute viral illness -Rapid influenza testing performed in UC is negative for influenza A and influenza B -COVID test collected and sent to lab for further testing should be available in 24 hours -Prescribed azelastine 0.1% nasal spray twice daily as needed for nasal congestion and postnasal drip -Prescribed benzonatate 200 mg capsule 3 times daily as needed for cough suppression -Prescribed ibuprofen 800 mg every 6 hours as needed for body aches and headache secondary to viral illness. -Continue monitoring symptoms for any changes in severity, any escalation of symptoms follow-up for further evaluation and management.

## 2023-08-20 NOTE — ED Provider Notes (Signed)
 [facility]  Note:  This document was prepared using Dragon voice recognition software and may include unintentional dictation errors.  MRN: [id number] DOB: [birth year]  Subjective:   Casey Gutierrez is a 23 y.o. male presenting for cough, nasal congestion, chills, dizziness, fever, fatigue, body aches and weakness since Sunday.  Patient denies any known sick contacts.  Patient has been using Sudafed and Vicks nasal spray with no relief.  Patient denies any shortness of breath, chest pain, nausea/vomiting, diarrhea, abdominal pain.  No current facility-administered medications for this encounter.  Current Outpatient Medications:    azelastine (ASTELIN) 0.1 % nasal spray, Place 1 spray into both nostrils 2 (two) times daily. Use in each nostril as directed, Disp: 30 mL, Rfl: 1   benzonatate (TESSALON) 200 MG capsule, Take 1 capsule (200 mg total) by mouth 3 (three) times daily as needed for cough., Disp: 20 capsule, Rfl: 0   ibuprofen (ADVIL) 800 MG tablet, Take 1 tablet (800 mg total) by mouth 3 (three) times daily., Disp: 21 tablet, Rfl: 0   No Known Allergies  Past Medical History:  Diagnosis Date   Migraines    Nosebleed    x years as of 2024     Past Surgical History:  Procedure Laterality Date   MYRINGOTOMY WITH TUBE PLACEMENT  2.13   1.18.16 normal hearing bilaterally   TONSILLECTOMY      Family History  Problem Relation Age of Onset   Hypertension Mother    Diabetes Mother    Polycystic ovary syndrome Sister    Obesity Brother        also with non-alcoholic fatty liver disease   Obesity Brother        also with nonalcoholic fatty liver disease   Cancer Neg Hx    Heart disease Neg Hx     Social History   Tobacco Use   Smoking status: Never   Smokeless tobacco: Never  Vaping Use   Vaping status: Never Used  Substance Use Topics   Alcohol use: No   Drug use: Never    ROS Refer to HPI for ROS details.  Objective:   Vitals: BP  127/76 (BP Location: Right Arm)   Pulse (!) 107   Temp (!) 101.4 F (38.6 C) (Oral)   Resp 16   Ht 5\' 10"  (1.778 m)   Wt 206 lb (93.4 kg)   SpO2 96%   BMI 29.56 kg/m   Physical Exam Vitals and nursing note reviewed.  Constitutional:      General: He is not in acute distress.    Appearance: Normal appearance. He is well-developed and normal weight. He is ill-appearing. He is not toxic-appearing or diaphoretic.  HENT:     Head: Normocephalic.     Right Ear: Tympanic membrane, ear canal and external ear normal.     Left Ear: Tympanic membrane, ear canal and external ear normal.     Nose: Congestion and rhinorrhea present.     Mouth/Throat:     Mouth: Mucous membranes are moist.     Pharynx: Oropharynx is clear. Posterior oropharyngeal erythema present. No oropharyngeal exudate.  Eyes:     General:        Right eye: No discharge.        Left eye: No discharge.     Extraocular Movements: Extraocular movements intact.     Conjunctiva/sclera: Conjunctivae normal.     Pupils: Pupils are equal, round, and reactive to light.  Cardiovascular:     Rate  and Rhythm: Normal rate and regular rhythm.     Heart sounds: No murmur heard. Pulmonary:     Effort: Pulmonary effort is normal. No respiratory distress.     Breath sounds: Normal breath sounds. No stridor. No wheezing, rhonchi or rales.  Musculoskeletal:        General: No swelling.  Skin:    General: Skin is warm and dry.  Neurological:     General: No focal deficit present.     Mental Status: He is alert and oriented to person, place, and time.  Psychiatric:        Mood and Affect: Mood normal.     Procedures  Results for orders placed or performed during the hospital encounter of 08/20/23 (from the past 24 hours)  POC Influenza A/B     Status: None   Collection Time: 08/20/23 11:21 AM  Result Value Ref Range   Influenza A, POC Negative Negative   Influenza B, POC Negative Negative    Assessment and Plan :   PDMP  not reviewed this encounter.  1. Acute viral syndrome     Acute viral illness -Rapid influenza testing performed in UC is negative for influenza A and influenza B -COVID test collected and sent to lab for further testing should be available in 24 hours -Prescribed azelastine 0.1% nasal spray twice daily as needed for nasal congestion and postnasal drip -Prescribed benzonatate 200 mg capsule 3 times daily as needed for cough suppression -Prescribed ibuprofen 800 mg every 6 hours as needed for body aches and headache secondary to viral illness. -Continue monitoring symptoms for any changes in severity, any escalation of symptoms follow-up for further evaluation and management.  Lucky Cowboy   [place], [place], [place] 08/20/23 1148

## 2023-12-25 ENCOUNTER — Encounter: Payer: Self-pay | Admitting: Podiatry

## 2023-12-25 ENCOUNTER — Ambulatory Visit (INDEPENDENT_AMBULATORY_CARE_PROVIDER_SITE_OTHER): Admitting: Podiatry

## 2023-12-25 VITALS — Ht 70.0 in | Wt 206.0 lb

## 2023-12-25 DIAGNOSIS — L600 Ingrowing nail: Secondary | ICD-10-CM

## 2023-12-25 NOTE — Patient Instructions (Signed)

## 2023-12-26 NOTE — Progress Notes (Signed)
 Subjective:   Patient ID: Casey Gutierrez, male   DOB: 23 y.o.   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  I infiltrated the right big toe 60 mg like Marcaine  mixture sterile prep done using sterile instrumentation remove the medial border exposed matrix applied phenol 3 applications 30 seconds followed by alcohol lavage sterile dressing gave instructions on soaks wear dressing 24 hours take it off earlier if throbbing were to occur with all questions answered today and call with questions concerns

## 2024-02-27 ENCOUNTER — Encounter (HOSPITAL_COMMUNITY): Payer: Self-pay | Admitting: Emergency Medicine

## 2024-02-27 ENCOUNTER — Ambulatory Visit (HOSPITAL_COMMUNITY)
Admission: EM | Admit: 2024-02-27 | Discharge: 2024-02-27 | Disposition: A | Discharge status: Home / Self Care | Attending: Emergency Medicine | Admitting: Emergency Medicine

## 2024-02-27 DIAGNOSIS — L237 Allergic contact dermatitis due to plants, except food: Secondary | ICD-10-CM

## 2024-02-27 MED ORDER — PREDNISONE 10 MG (21) PO TBPK: ORAL_TABLET | Freq: Every day | ORAL | 0 refills | Status: AC

## 2024-02-27 MED ORDER — TRIAMCINOLONE ACETONIDE 0.1 % EX CREA: 1.0000 | TOPICAL_CREAM | Freq: Two times a day (BID) | CUTANEOUS | 0 refills | Status: AC

## 2024-02-27 MED ORDER — DEXAMETHASONE SODIUM PHOSPHATE 10 MG/ML IJ SOLN
INTRAMUSCULAR | Status: AC
  Filled 2024-02-27: qty 1

## 2024-02-27 MED ORDER — DEXAMETHASONE SODIUM PHOSPHATE 10 MG/ML IJ SOLN
10.0000 mg | Freq: Once | INTRAMUSCULAR | Status: AC
  Administered 2024-02-27: 10 mg via INTRAMUSCULAR

## 2024-02-27 NOTE — ED Triage Notes (Signed)
 Pt reports he has rash on forearms, stomach and genital area since Monday. Believes it is poison ivy. Itches and irritating. Tried OTC cream that hasn't helped.

## 2024-02-27 NOTE — ED Provider Notes (Signed)
 [facility]    CSN: [id number] Arrival date & time: 02/27/24  1630      History   Chief Complaint Chief Complaint  Patient presents with   Rash    HPI Casey Gutierrez is a 23 y.o. male.   Patient presents with a rash to his forearms, stomach, and genital region that began on 9/15.  Patient states that he was working out in the yard on 9/14 and believes he was exposed to poison ivy and then began to notice the rash the next day.  Patient reports the rash is itchy and irritating.  Patient states that he has been applying over-the-counter creams with minimal relief.  The history is provided by the patient and medical records.  Rash   Past Medical History:  Diagnosis Date   Migraines    Nosebleed    x years as of 2024    Patient Active Problem List   Diagnosis Date Noted   Encounter for health maintenance examination in adult 03/18/2023   Chronic bilateral low back pain without sciatica 03/18/2023   Ganglion cyst 03/18/2023   Screen for STD (sexually transmitted disease) 03/18/2023   Needs flu shot 03/18/2023   Screening for lipid disorders 03/18/2023   Mixed hyperlipidemia 03/28/2021   Frequent nosebleeds 03/28/2021   Abnormal thyroid  blood test 10/13/2019   Frequent headaches 03/11/2015   Obesity peds (BMI >=95 percentile) 03/11/2015    Past Surgical History:  Procedure Laterality Date   MYRINGOTOMY WITH TUBE PLACEMENT  2.13   1.18.16 normal hearing bilaterally   TONSILLECTOMY         Home Medications    Prior to Admission medications   Medication Sig Start Date End Date Taking? Authorizing Provider  predniSONE  (STERAPRED UNI-PAK 21 TAB) 10 MG (21) TBPK tablet Take by mouth daily. Take 6 tabs by mouth daily  for 2 days, then 5 tabs for 2 days, then 4 tabs for 2 days, then 3 tabs for 2 days, 2 tabs for 2 days, then 1 tab by mouth daily for 2 days 02/27/24  Yes Johnie, Rumaldo A, NP  triamcinolone  cream (KENALOG ) 0.1 % Apply 1 Application  topically 2 (two) times daily. 02/27/24  Yes Johnie, Basilia Stuckert A, NP  azelastine  (ASTELIN ) 0.1 % nasal spray Place 1 spray into both nostrils 2 (two) times daily. Use in each nostril as directed 08/20/23   Reddick, Johnathan B, NP  benzonatate  (TESSALON ) 200 MG capsule Take 1 capsule (200 mg total) by mouth 3 (three) times daily as needed for cough. 08/20/23   Reddick, Johnathan B, NP  ibuprofen  (ADVIL ) 800 MG tablet Take 1 tablet (800 mg total) by mouth 3 (three) times daily. 08/20/23   Aurea Ethel NOVAK, NP    Family History Family History  Problem Relation Age of Onset   Hypertension Mother    Diabetes Mother    Polycystic ovary syndrome Sister    Obesity Brother        also with non-alcoholic fatty liver disease   Obesity Brother        also with nonalcoholic fatty liver disease   Cancer Neg Hx    Heart disease Neg Hx     Social History Social History   Tobacco Use   Smoking status: Never   Smokeless tobacco: Never  Vaping Use   Vaping status: Never Used  Substance Use Topics   Alcohol use: No   Drug use: Never     Allergies   Patient has no known allergies.  Review of Systems Review of Systems  Skin:  Positive for rash.   Per HPI  Physical Exam Triage Vital Signs ED Triage Vitals  Encounter Vitals Group     BP 02/27/24 1642 123/74     Girls Systolic BP Percentile --      Girls Diastolic BP Percentile --      Boys Systolic BP Percentile --      Boys Diastolic BP Percentile --      Pulse Rate 02/27/24 1642 70     Resp 02/27/24 1642 17     Temp 02/27/24 1642 98.2 F (36.8 C)     Temp Source 02/27/24 1642 Oral     SpO2 02/27/24 1642 97 %     Weight --      Height --      Head Circumference --      Peak Flow --      Pain Score 02/27/24 1641 0     Pain Loc --      Pain Education --      Exclude from Growth Chart --    No data found.  Updated Vital Signs BP 123/74 (BP Location: Right Arm)   Pulse 70   Temp 98.2 F (36.8 C) (Oral)   Resp 17    SpO2 97%   Visual Acuity Right Eye Distance:   Left Eye Distance:   Bilateral Distance:    Right Eye Near:   Left Eye Near:    Bilateral Near:     Physical Exam Vitals and nursing note reviewed.  Constitutional:      General: He is awake. He is not in acute distress.    Appearance: Normal appearance. He is well-developed and well-groomed. He is not ill-appearing.  Genitourinary:    Comments: Exam declined.  Patient reports that rash is similar to rash on forearms and abdominal wall. Skin:    General: Skin is warm and dry.     Findings: Rash present. Rash is macular and papular.     Comments: Diffuse erythematous maculopapular rash noted to bilateral forearms and abdominal wall.  Neurological:     Mental Status: He is alert.  Psychiatric:        Behavior: Behavior is cooperative.      UC Treatments / Results  Labs (all labs ordered are listed, but only abnormal results are displayed) Labs Reviewed - No data to display  EKG   Radiology No results found.  Procedures Procedures (including critical care time)  Medications Ordered in UC Medications  dexamethasone  (DECADRON ) injection 10 mg (has no administration in time range)    Initial Impression / Assessment and Plan / UC Course  I have reviewed the triage vital signs and the nursing notes.  Pertinent labs & imaging results that were available during my care of the patient were reviewed by me and considered in my medical decision making (see chart for details).     Patient is overall well-appearing.  Vitals are stable.  Exam findings appear to be consistent with poison ivy dermatitis.  Given IM Decadron  in clinic.  Prescribed prednisone  taper.  Prescribed triamcinolone  cream.  Discussed use of over-the-counter antihistamines.  Discussed follow-up and return precautions. Final Clinical Impressions(s) / UC Diagnoses   Final diagnoses:  Poison ivy dermatitis     Discharge Instructions      You are given an  injection of Decadron  in clinic today which is steroid to help with your rash. Tomorrow start taking prednisone  taper as instructed to  on the package. You can also apply triamcinolone  cream twice daily to the rash as well.  Avoid applying this to the groin region. You can take cetirizine (Zyrtec) or loratadine (Claritin) once daily to help with itching and rash as well.  You can take 25 mg of Benadryl if needed every 6 hours as needed for itching and rash as well.  This will make you drowsy so do not drive, work, or drink alcohol while taking this. Follow-up with your primary care provider or return here as needed.    ED Prescriptions     Medication Sig Dispense Auth. Provider   predniSONE  (STERAPRED UNI-PAK 21 TAB) 10 MG (21) TBPK tablet Take by mouth daily. Take 6 tabs by mouth daily  for 2 days, then 5 tabs for 2 days, then 4 tabs for 2 days, then 3 tabs for 2 days, 2 tabs for 2 days, then 1 tab by mouth daily for 2 days 42 tablet Johnie Flaming A, NP   triamcinolone  cream (KENALOG ) 0.1 % Apply 1 Application topically 2 (two) times daily. 30 g Johnie Flaming A, NP      PDMP not reviewed this encounter.   Johnie Flaming A, NP 02/27/24 1721

## 2024-02-27 NOTE — Discharge Instructions (Addendum)
 You are given an injection of Decadron  in clinic today which is steroid to help with your rash. Tomorrow start taking prednisone  taper as instructed to on the package. You can also apply triamcinolone  cream twice daily to the rash as well.  Avoid applying this to the groin region. You can take cetirizine (Zyrtec) or loratadine (Claritin) once daily to help with itching and rash as well.  You can take 25 mg of Benadryl if needed every 6 hours as needed for itching and rash as well.  This will make you drowsy so do not drive, work, or drink alcohol while taking this. Follow-up with your primary care provider or return here as needed.

## 2024-02-28 ENCOUNTER — Ambulatory Visit (HOSPITAL_COMMUNITY)
